# Patient Record
Sex: Male | Born: 2000 | Race: White | Hispanic: No | Marital: Single | Smoking: Never smoker
Health system: Southern US, Community
[De-identification: ages and names within clinical notes are randomized; demographics above are authoritative.]

## PROBLEM LIST (undated history)

## (undated) DIAGNOSIS — R102 Pelvic and perineal pain: Secondary | ICD-10-CM

## (undated) HISTORY — PX: TONSILECTOMY, ADENOIDECTOMY, BILATERAL MYRINGOTOMY AND TUBES: SHX2538

## (undated) HISTORY — PX: MOUTH SURGERY: SHX715

---
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
## 2023-04-28 ENCOUNTER — Telehealth: Payer: Self-pay

## 2023-04-28 NOTE — Telephone Encounter (Signed)
Copied from CRM [id number]. Topic: Appointments - Transfer of Care >> Apr 28, 2023  2:36 PM Samuel Jester B wrote: Pt is requesting to transfer FROM: [place], [facility] Dr. Noralyn Pick  Pt is requesting to transfer : Dr. Dana Allan Reason for requested transfer TO: He is now at the age of 27  It is the responsibility of the team the patient would like to transfer to (Dr. Dana Allan) to reach out to the patient if for any reason this transfer is not acceptable.  I spoke with patient.  Patient has already scheduled his appointment.  I let patient know that he needs to complete the new patient information prior to check-in.  Patient requested I mail him a packet.  I mailed new patient packet to patient.

## 2023-06-16 ENCOUNTER — Telehealth: Payer: Self-pay

## 2023-06-16 NOTE — Telephone Encounter (Signed)
 Dr Clent Ridges is leaving the practice and your New Patient or Transfer of Care appointment needs to be rescheduled with another provider. Please call the office to schedule a Transfer of Care to either Dr Charlann Lange, Darleen Crocker or Kara Dies, NP.   Thank you  LMOM for patient to call back. E2C2, please reschedule this New Patient visit. [facility]

## 2023-09-12 ENCOUNTER — Ambulatory Visit: Payer: 59 | Admitting: Family Medicine

## 2023-09-25 ENCOUNTER — Ambulatory Visit: Admitting: Nurse Practitioner

## 2023-09-25 ENCOUNTER — Encounter: Payer: Self-pay | Admitting: Nurse Practitioner

## 2023-09-25 VITALS — BP 118/72 | HR 85 | Temp 97.9°F | Ht 66.0 in | Wt 150.0 lb

## 2023-09-25 DIAGNOSIS — R41840 Attention and concentration deficit: Secondary | ICD-10-CM

## 2023-09-25 DIAGNOSIS — Z7689 Persons encountering health services in other specified circumstances: Secondary | ICD-10-CM

## 2023-09-25 DIAGNOSIS — R5383 Other fatigue: Secondary | ICD-10-CM | POA: Diagnosis not present

## 2023-09-25 DIAGNOSIS — R220 Localized swelling, mass and lump, head: Secondary | ICD-10-CM

## 2023-09-25 DIAGNOSIS — Z13220 Encounter for screening for lipoid disorders: Secondary | ICD-10-CM | POA: Diagnosis not present

## 2023-09-25 DIAGNOSIS — Z7289 Other problems related to lifestyle: Secondary | ICD-10-CM

## 2023-09-25 NOTE — Progress Notes (Signed)
 New Patient Office Visit  Subjective    Patient ID: Kristopher Olson, male    DOB: 04-06-2000  Age: 23 y.o. MRN: [id number]  CC:  Chief Complaint  Patient presents with   Establish Care   Discussed the use of a AI scribe software for clinical note transcription with the patient, who gave verbal consent to proceed.  HPI Kristopher Olson presents to establish care.  He was previously seen at [facility] pediatrics.  He has difficulty concentrating, describing himself as easily distracted and losing focus.  He would like to get tested for ADHD.  He feels tired frequently despite sleeping well, sometimes feeling he sleeps too much.  He exercises six times a week and works in [employer], spending most of his day outdoors.  He vapes daily and experiences a heavy feeling in his chest upon waking, sometimes coughing up black material with excessive vaping. He consumes alcohol socially, drinking six to seven cans of beer every other weekend or sometimes every weekend.  Outpatient Encounter Medications as of 09/25/2023  Medication Sig   flintstones complete (FLINTSTONES) 60 MG chewable tablet Chew 1 tablet by mouth daily. (Patient not taking: Reported on 09/25/2023)   ibuprofen (ADVIL,MOTRIN) 200 MG tablet Take 200 mg by mouth every 6 (six) hours as needed for headache. (Patient not taking: Reported on 09/25/2023)   Meth-Hyo-M Bl-Na Phos-Ph Sal (URIBEL ) 118 MG CAPS Take 1 capsule (118 mg total) by mouth every 6 (six) hours as needed (dysuria). (Patient not taking: Reported on 09/25/2023)   No facility-administered encounter medications on file as of 09/25/2023.    Past Medical History:  Diagnosis Date   Pelvic pain     Past Surgical History:  Procedure Laterality Date   CYSTOSCOPY N/A 06/18/2016   Procedure: CYSTOSCOPY;  Surgeon: Ozell JONELLE Burkes, MD;  Location: [facility] ORS;  Service: Urology;  Laterality: N/A;   MOUTH SURGERY     TONSILECTOMY, ADENOIDECTOMY, BILATERAL MYRINGOTOMY AND TUBES       Family History  Problem Relation Age of Onset   Asthma Mother    Learning disabilities Brother    Hypertension Maternal Grandfather    Hyperlipidemia Maternal Grandfather    Arthritis Paternal Grandmother     Social History   Socioeconomic History   Marital status: Single    Spouse name: Not on file   Number of children: Not on file   Years of education: Not on file   Highest education level: Not on file  Occupational History   Not on file  Tobacco Use   Smoking status: Never   Smokeless tobacco: Never  Vaping Use   Vaping status: Every Day  Substance and Sexual Activity   Alcohol use: Yes    Alcohol/week: 6.0 standard drinks of alcohol    Types: 6 Cans of beer per week    Comment: at weekend   Drug use: No   Sexual activity: Yes    Partners: Female  Other Topics Concern   Not on file  Social History Narrative   Not on file   Social Drivers of Health   Financial Resource Strain: Not on file  Food Insecurity: Not on file  Transportation Needs: Not on file  Physical Activity: Not on file  Stress: Not on file  Social Connections: Not on file  Intimate Partner Violence: Not on file    ROS      Objective    BP 118/72   Pulse 85   Temp 97.9 F (36.6 C)  Ht 5' 6 (1.676 m)   Wt 150 lb (68 kg)   SpO2 98%   BMI 24.21 kg/m   Physical Exam Constitutional:      Appearance: Normal appearance.  HENT:     Head:     Comments: 1.5 cm nontender lump on the posterior right occipital.    Right Ear: Tympanic membrane normal.     Left Ear: Tympanic membrane normal.     Mouth/Throat:     Mouth: Mucous membranes are moist.  Eyes:     Conjunctiva/sclera: Conjunctivae normal.     Pupils: Pupils are equal, round, and reactive to light.  Cardiovascular:     Rate and Rhythm: Normal rate and regular rhythm.     Pulses: Normal pulses.     Heart sounds: Normal heart sounds.  Pulmonary:     Effort: Pulmonary effort is normal.     Breath sounds: Normal breath  sounds.  Abdominal:     General: Bowel sounds are normal.     Palpations: Abdomen is soft.  Musculoskeletal:     Cervical back: Normal range of motion. No tenderness.  Skin:    General: Skin is warm.     Findings: No bruising.  Neurological:     General: No focal deficit present.     Mental Status: He is alert and oriented to person, place, and time. Mental status is at baseline.  Psychiatric:        Mood and Affect: Mood normal.        Behavior: Behavior normal.        Thought Content: Thought content normal.        Judgment: Judgment normal.         Assessment & Plan:   Problem List Items Addressed This Visit       Unprioritized   Other fatigue - Primary   Intermittent fatigue and tiredness. -Will check labs as ordered      Relevant Orders   CBC with Differential/Platelet (Completed)   Comprehensive metabolic panel with GFR (Completed)   TSH (Completed)   Head lump   Non-tender 1.5 cm lump on posterior right occipital for a few years and have slightly increased in size. - Will refer to dermatology for further evaluation and management.      Relevant Orders   Ambulatory referral to Dermatology   Disturbed concentration   He has difficulty concentrating, describing himself as easily distracted and losing focus.  He would like to get tested for ADHD. - Will refer for ADHD testing      Relevant Orders   Ambulatory referral to Psychiatry   Current every day vaping   Daily vaping with respiratory symptoms. Emphasized cessation due to potential harm from vaping substances.      Other Visit Diagnoses       Screening for lipid disorders       Relevant Orders   Lipid panel (Completed)       No follow-ups on file.   Kavari Parrillo, NP

## 2023-09-25 NOTE — Patient Instructions (Addendum)
 Today, you came in for a new patient appointment to establish care. We discussed several health concerns including growths on your head and finger, vaping, alcohol use, sexual health, and difficulty concentrating. We also reviewed your general health maintenance.  YOUR PLAN:  HEAD LUMP: You have non-painful Lump on your head that have been present for a few years and have slightly increased in size. -We will send to dermatology  NICOTINE DEPENDENCE: You vape daily and have experienced respiratory symptoms such as a heavy feeling in your chest and coughing up black material. -It is important to stop vaping due to the potential harm from vaping substances.  ALCOHOL USE: You consume alcohol socially, drinking six to seven cans of beer every other weekend or sometimes every weekend. -We discussed reducing your alcohol intake to mitigate health risks.  ATTENTION-DEFICIT/HYPERACTIVITY DISORDER (ADHD): You have difficulty concentrating, are easily distracted, and feel tired frequently despite sleeping well. -We will refer you for ADHD testing to confirm the diagnosis and guide treatment.

## 2023-09-26 LAB — CBC WITH DIFFERENTIAL/PLATELET
Basophils Absolute: 0 x10E3/uL (ref 0.0–0.2)
Basos: 1 %
EOS (ABSOLUTE): 0.1 x10E3/uL (ref 0.0–0.4)
Eos: 1 %
Hematocrit: 49 % (ref 37.5–51.0)
Hemoglobin: 15.7 g/dL (ref 13.0–17.7)
Immature Grans (Abs): 0 x10E3/uL (ref 0.0–0.1)
Immature Granulocytes: 0 %
Lymphocytes Absolute: 1.7 x10E3/uL (ref 0.7–3.1)
Lymphs: 27 %
MCH: 30.1 pg (ref 26.6–33.0)
MCHC: 32 g/dL (ref 31.5–35.7)
MCV: 94 fL (ref 79–97)
Monocytes Absolute: 0.7 x10E3/uL (ref 0.1–0.9)
Monocytes: 11 %
Neutrophils Absolute: 3.7 x10E3/uL (ref 1.4–7.0)
Neutrophils: 60 %
Platelets: 270 x10E3/uL (ref 150–450)
RBC: 5.22 x10E6/uL (ref 4.14–5.80)
RDW: 12.6 % (ref 11.6–15.4)
WBC: 6.2 x10E3/uL (ref 3.4–10.8)

## 2023-09-26 LAB — LIPID PANEL
Chol/HDL Ratio: 2.5 ratio (ref 0.0–5.0)
Cholesterol, Total: 160 mg/dL (ref 100–199)
HDL: 65 mg/dL (ref >39)
LDL Chol Calc (NIH): 79 mg/dL (ref 0–99)
Triglycerides: 89 mg/dL (ref 0–149)
VLDL Cholesterol Cal: 16 mg/dL (ref 5–40)

## 2023-09-26 LAB — COMPREHENSIVE METABOLIC PANEL WITH GFR
ALT: 13 IU/L (ref 0–44)
AST: 26 IU/L (ref 0–40)
Albumin: 5 g/dL (ref 4.3–5.2)
Alkaline Phosphatase: 55 IU/L (ref 44–121)
BUN/Creatinine Ratio: 13 (ref 9–20)
BUN: 13 mg/dL (ref 6–20)
Bilirubin Total: 1.7 mg/dL — ABNORMAL HIGH (ref 0.0–1.2)
CO2: 22 mmol/L (ref 20–29)
Calcium: 10.2 mg/dL (ref 8.7–10.2)
Chloride: 98 mmol/L (ref 96–106)
Creatinine, Ser: 1.03 mg/dL (ref 0.76–1.27)
Globulin, Total: 2 g/dL (ref 1.5–4.5)
Glucose: 68 mg/dL — ABNORMAL LOW (ref 70–99)
Potassium: 4.1 mmol/L (ref 3.5–5.2)
Sodium: 141 mmol/L (ref 134–144)
Total Protein: 7 g/dL (ref 6.0–8.5)
eGFR: 106 mL/min/1.73 (ref >59)

## 2023-09-26 LAB — TSH: TSH: 1.48 u[IU]/mL (ref 0.450–4.500)

## 2023-10-09 ENCOUNTER — Encounter: Admitting: Nurse Practitioner

## 2023-10-14 ENCOUNTER — Encounter: Payer: Self-pay | Admitting: Nurse Practitioner

## 2023-10-16 ENCOUNTER — Encounter: Payer: Self-pay | Admitting: Nurse Practitioner

## 2023-10-16 ENCOUNTER — Ambulatory Visit: Payer: Self-pay | Admitting: Nurse Practitioner

## 2023-10-16 DIAGNOSIS — R220 Localized swelling, mass and lump, head: Secondary | ICD-10-CM | POA: Insufficient documentation

## 2023-10-16 DIAGNOSIS — R17 Unspecified jaundice: Secondary | ICD-10-CM

## 2023-10-16 DIAGNOSIS — R41840 Attention and concentration deficit: Secondary | ICD-10-CM | POA: Insufficient documentation

## 2023-10-16 DIAGNOSIS — R5383 Other fatigue: Secondary | ICD-10-CM | POA: Insufficient documentation

## 2023-10-16 DIAGNOSIS — Z7289 Other problems related to lifestyle: Secondary | ICD-10-CM | POA: Insufficient documentation

## 2023-10-16 NOTE — Assessment & Plan Note (Addendum)
 Intermittent fatigue and tiredness. -Will check labs as ordered

## 2023-10-16 NOTE — Assessment & Plan Note (Addendum)
 Non-tender 1.5 cm lump on posterior right occipital for a few years and have slightly increased in size. - Will refer to dermatology for further evaluation and management.

## 2023-10-16 NOTE — Assessment & Plan Note (Signed)
 Daily vaping with respiratory symptoms. Emphasized cessation due to potential harm from vaping substances.

## 2023-10-16 NOTE — Assessment & Plan Note (Signed)
 He has difficulty concentrating, describing himself as easily distracted and losing focus.  He would like to get tested for ADHD. - Will refer for ADHD testing

## 2023-10-20 ENCOUNTER — Ambulatory Visit: Admitting: Dermatology

## 2023-10-20 ENCOUNTER — Encounter: Payer: Self-pay | Admitting: Dermatology

## 2023-10-20 DIAGNOSIS — D492 Neoplasm of unspecified behavior of bone, soft tissue, and skin: Secondary | ICD-10-CM

## 2023-10-20 DIAGNOSIS — L729 Follicular cyst of the skin and subcutaneous tissue, unspecified: Secondary | ICD-10-CM | POA: Diagnosis not present

## 2023-10-20 DIAGNOSIS — D485 Neoplasm of uncertain behavior of skin: Secondary | ICD-10-CM

## 2023-10-20 NOTE — Patient Instructions (Addendum)
 Pre-Operative Instructions You are scheduled for a surgical procedure at [facility]. We recommend you read the following instructions. If you have any questions or concerns, please call the office at [phone].  Shower and wash the entire body with soap and water the day of your surgery paying special attention to cleansing at and around the planned surgery site.  Please continue to take your anticoagulants (blood thinners) as you normally     would before and after surgery if they were prescribed by a medical provider. Stopping them could be harmful to you. We have multiple tools in dermatology to stop the bleeding even if you take an anticoagulant. If you take over the counter blood thinner such as aspirin, Ibuprofen (Motrin, Advil and Nuprin), Naprosyn , Voltaren , Relafen, etc. that was not prescribed or recommended by a medical provider, we recommend that you stop taking it for a week before your surgery and wait to restart until 2 days after your surgery.  Please inform us  of all medications you are currently taking. All medications that are taken regularly should be taken the day of surgery as you always do. Nevertheless, we need to be informed of what medications you are taking prior to surgery to know whether they will affect the procedure or cause any complications.   Please inform us  of any medication allergies. Also inform us  of whether you have allergies to Latex or rubber products or whether you have had any adverse reaction to Lidocaine  or Epinephrine .  Please inform us  of any prosthetic or artificial body parts such as artificial heart valve, joint replacements, etc., or similar condition that might require preoperative antibiotics.   We recommend avoidance of alcohol at least two weeks prior to surgery and continued avoidance for at least two weeks after surgery.   We recommend discontinuation of tobacco smoking at least two weeks prior to surgery and continued  abstinence for at least two weeks after surgery.  Do not plan strenuous exercise, strenuous work or strenuous lifting for approximately four weeks after your surgery.   We request if you are unable to make your scheduled surgical appointment, please call us  at least a week in advance or as soon as you are aware of a problem so that we can cancel or reschedule the appointment.   You MAY TAKE TYLENOL  (acetaminophen ) for pain as it is not a blood thinner.   PLEASE PLAN TO BE IN TOWN FOR TWO WEEKS FOLLOWING SURGERY, THIS IS IMPORTANT SO YOU CAN BE CHECKED FOR DRESSING CHANGES, FUTURE REMOVAL AND TO MONITOR FOR POSSIBLE COMPLICATIONS.   Due to recent changes in healthcare laws, you may see results of your pathology and/or laboratory studies on MyChart before the doctors have had a chance to review them. We understand that in some cases there may be results that are confusing or concerning to you. Please understand that not all results are received at the same time and often the doctors may need to interpret multiple results in order to provide you with the best plan of care or course of treatment. Therefore, we ask that you please give us  2 business days to thoroughly review all your results before contacting the office for clarification. Should we see a critical lab result, you will be contacted sooner.   If You Need Anything After Your Visit  If you have any questions or concerns for your doctor, please call our main line at [phone] and press option 4 to reach your doctor's medical assistant. If no  one answers, please leave a voicemail as directed and we will return your call as soon as possible. Messages left after 4 pm will be answered the following business day.   You may also send us  a message via MyChart. We typically respond to MyChart messages within 1-2 business days.  For prescription refills, please ask your pharmacy to contact our office. Our fax number is [phone].  If you have  an urgent issue when the clinic is closed that cannot wait until the next business day, you can page your doctor at the number below.    Please note that while we do our best to be available for urgent issues outside of office hours, we are not available 24/7.   If you have an urgent issue and are unable to reach us , you may choose to seek medical care at your doctor's office, retail clinic, urgent care center, or emergency room.  If you have a medical emergency, please immediately call 911 or go to the emergency department.  Pager Numbers  - Dr. Hester: [phone]  - Dr. Jackquline: [phone]  - Dr. Claudene: [phone]   In the event of inclement weather, please call our main line at [phone] for an update on the status of any delays or closures.  Dermatology Medication Tips: Please keep the boxes that topical medications come in in order to help keep track of the instructions about where and how to use these. Pharmacies typically print the medication instructions only on the boxes and not directly on the medication tubes.   If your medication is too expensive, please contact our office at [phone] option 4 or send us  a message through MyChart.   We are unable to tell what your co-pay for medications will be in advance as this is different depending on your insurance coverage. However, we may be able to find a substitute medication at lower cost or fill out paperwork to get insurance to cover a needed medication.   If a prior authorization is required to get your medication covered by your insurance company, please allow us  1-2 business days to complete this process.  Drug prices often vary depending on where the prescription is filled and some pharmacies may offer cheaper prices.  The website www.goodrx.com contains coupons for medications through different pharmacies. The prices here do not account for what the cost may be with help from insurance (it may be cheaper with  your insurance), but the website can give you the price if you did not use any insurance.  - You can print the associated coupon and take it with your prescription to the pharmacy.  - You may also stop by our office during regular business hours and pick up a GoodRx coupon card.  - If you need your prescription sent electronically to a different pharmacy, notify our office through [facility] or by phone at [phone] option 4.     Si Usted Necesita Algo Despus de Su Visita  Tambin puede enviarnos un mensaje a travs de Clinical cytogeneticist. Por lo general respondemos a los mensajes de MyChart en el transcurso de 1 a 2 das hbiles.  Para renovar recetas, por favor pida a su farmacia que se ponga en contacto con nuestra oficina. Randi lakes de fax es [place] [phone].  Si tiene un asunto urgente cuando la clnica est cerrada y que no puede esperar hasta el siguiente da hbil, puede llamar/localizar a su doctor(a) al nmero que aparece a continuacin.   Por favor, tenga en  cuenta que aunque hacemos todo lo posible para estar disponibles para asuntos urgentes fuera del horario de [place], no estamos disponibles las 24 horas del da, los 7 [address]  [address] de la [place].   Si tiene un problema urgente y no puede comunicarse con nosotros, puede optar por buscar atencin mdica  en el consultorio de su doctor(a), en una clnica privada, en un centro de atencin urgente o en una sala de emergencias.  Si tiene Engineer, drilling, por favor llame inmediatamente al 911 o vaya a la sala de emergencias.  Nmeros de bper  - Dr. Hester: [phone]  - Dra. Jackquline: [phone]  - Dr. Claudene: [phone]   En caso de inclemencias del tiempo, por favor llame a landry capes principal al [phone] para una actualizacin sobre el [place] de cualquier retraso o cierre.  Consejos para la medicacin en dermatologa: Por favor, guarde las cajas en las que vienen los medicamentos de uso tpico para  ayudarle a seguir las instrucciones sobre dnde y cmo usarlos. Las farmacias generalmente imprimen las instrucciones del medicamento slo en las cajas y no directamente en los tubos del Magazine.   Si su medicamento es muy caro, por favor, pngase en contacto con landry rieger llamando al [phone] y presione la opcin 4 o envenos un mensaje a travs de Clinical cytogeneticist.   No podemos decirle cul ser su copago por los medicamentos por adelantado ya que esto es diferente dependiendo de la cobertura de su seguro. Sin embargo, es posible que podamos encontrar un medicamento sustituto a Audiological scientist un formulario para que el seguro cubra el medicamento que se considera necesario.   Si se requiere una autorizacin previa para que su compaa de seguros malta su medicamento, por favor permtanos de 1 a 2 das hbiles para completar este proceso.  Los precios de los medicamentos varan con frecuencia dependiendo del Environmental consultant de dnde se surte la receta y alguna farmacias pueden ofrecer precios ms baratos.  El sitio web www.goodrx.com tiene cupones para medicamentos de Health and safety inspector. Los precios aqu no tienen en cuenta lo que podra costar con la ayuda del seguro (puede ser ms barato con su seguro), pero el sitio web puede darle el precio si no utiliz Tourist information centre manager.  - Puede imprimir el cupn correspondiente y llevarlo con su receta a la farmacia.  - Tambin puede pasar por nuestra oficina durante el horario de atencin regular y Education officer, museum una tarjeta de cupones de GoodRx.  - Si necesita que su receta se enve electrnicamente a una farmacia diferente, informe a nuestra oficina a travs de MyChart de [place] o por telfono llamando al [phone] y presione la opcin 4.

## 2023-10-20 NOTE — Progress Notes (Signed)
   New Patient Visit   Subjective  Kristopher Olson is a 23 y.o. male who presents for the following: Patient reports lump on head present x3 years, not painful unless try to squeeze. Patient also reports bump on R middle finger, not painful or itchy, unsure of how long it has been present, no known injury.    The following portions of the chart were reviewed this encounter and updated as appropriate: medications, allergies, medical history  Review of Systems:  No other skin or systemic complaints except as noted in HPI or Assessment and Plan.  Objective  Well appearing patient in no apparent distress; mood and affect are within normal limits.  A focused examination was performed of the following areas: Scalp, R hand   Relevant exam findings are noted in the Assessment and Plan.    Assessment & Plan   Neoplasm of uncertain behavior Exam: 4 mm homogeneous pink papule at R third finger overlying DIP  Ddx granuloma annulare (although no other lesions on hands or elbows vs verruca vs other  Treatment Plan: No obvious evidence of wart or cancer, recommend observation or consider biopsy if patient desires. Patient prefers to observe and call us  back with any changes.   Pilar cyst vs other cyst Exam: Subcutaneous mobile doughy nodule at occipital scalp  Benign-appearing. Exam most consistent with an epidermal inclusion cyst. Discussed that a cyst is a benign growth that can grow over time and sometimes get irritated or inflamed. Recommend observation if it is not bothersome. Discussed option of surgical excision to remove it if it is growing, symptomatic, or other changes noted. Please call for new or changing lesions so they can be evaluated.  Discussed to completely remove would require surgical excision. Patient advised that would require to cut hair around cyst, discussed possible line scar after excision, but hair would grow back around scar. risk of infection recurrence bleeding  pain. Patient will schedule excision with Dr. Claudene.      FOLLICULAR CYST OF SKIN AND SUBCUTANEOUS TISSUE   NEOPLASM OF UNCERTAIN BEHAVIOR OF SKIN    Return for w/ Dr. Claudene, Cyst Surgery.  I, Jacquelynn V. Wilfred, CMA, am acting as scribe for Boneta Claudene, MD .   Documentation: I have reviewed the above documentation for accuracy and completeness, and I agree with the above.  Boneta Claudene, MD

## 2023-10-29 ENCOUNTER — Encounter: Admitting: Dermatology

## 2023-11-04 ENCOUNTER — Other Ambulatory Visit (INDEPENDENT_AMBULATORY_CARE_PROVIDER_SITE_OTHER)

## 2023-11-04 DIAGNOSIS — R17 Unspecified jaundice: Secondary | ICD-10-CM | POA: Diagnosis not present

## 2023-11-04 LAB — HEPATIC FUNCTION PANEL
ALT: 19 U/L (ref 0–53)
AST: 19 U/L (ref 0–37)
Albumin: 4.8 g/dL (ref 3.5–5.2)
Alkaline Phosphatase: 44 U/L (ref 39–117)
Bilirubin, Direct: 0.3 mg/dL (ref 0.0–0.3)
Total Bilirubin: 1.4 mg/dL — ABNORMAL HIGH (ref 0.2–1.2)
Total Protein: 6.8 g/dL (ref 6.0–8.3)

## 2023-11-05 ENCOUNTER — Encounter: Payer: Self-pay | Admitting: Dermatology

## 2023-11-05 ENCOUNTER — Ambulatory Visit: Admitting: Dermatology

## 2023-11-05 DIAGNOSIS — D367 Benign neoplasm of other specified sites: Secondary | ICD-10-CM | POA: Diagnosis not present

## 2023-11-05 DIAGNOSIS — L728 Other follicular cysts of the skin and subcutaneous tissue: Secondary | ICD-10-CM

## 2023-11-05 DIAGNOSIS — L729 Follicular cyst of the skin and subcutaneous tissue, unspecified: Secondary | ICD-10-CM

## 2023-11-05 MED ORDER — MUPIROCIN 2 % EX OINT: 1.0000 | TOPICAL_OINTMENT | Freq: Every day | CUTANEOUS | 1 refills | Status: AC

## 2023-11-05 NOTE — Progress Notes (Signed)
   Follow-Up Visit   Subjective  Kristopher Olson is a 23 y.o. male who presents for the following: Pilar cyst vs other cyst occipital scalp, pt presents for excision   The following portions of the chart were reviewed this encounter and updated as appropriate: medications, allergies, medical history  Review of Systems:  No other skin or systemic complaints except as noted in HPI or Assessment and Plan.  Objective  Well appearing patient in no apparent distress; mood and affect are within normal limits.   A focused examination was performed of the following areas: scalp  Relevant exam findings are noted in the Assessment and Plan.  Occipital scalp Subcutaneous mobile doughy nodule 3.5 x 2.4cm   Assessment & Plan     FOLLICULAR CYST OF SKIN AND SUBCUTANEOUS TISSUE Occipital scalp Start Mupirocin  oint qd to excision site Skin excision - Occipital scalp  Excision method:  elliptical Lesion length (cm):  3.5 Lesion width (cm):  2.4 Margin per side (cm):  0 Total excision diameter (cm):  3.5 Informed consent: discussed and consent obtained   Timeout: patient name, date of birth, surgical site, and procedure verified   Procedure prep:  Patient was prepped and draped in usual sterile fashion Prep type:  Chlorhexidine Anesthesia: the lesion was anesthetized in a standard fashion   Anesthetic:  1% lidocaine  w/ epinephrine 1-100,000 buffered w/ 8.4% NaHCO3 (18cc lido w/ epi) Instrument used: #15 blade   Hemostasis achieved with: suture, pressure and electrodesiccation   Outcome: patient tolerated procedure well with no complications    Skin repair - Occipital scalp Complexity:  Intermediate Final length (cm):  3 Informed consent: discussed and consent obtained   Timeout: patient name, date of birth, surgical site, and procedure verified   Procedure prep:  Patient was prepped and draped in usual sterile fashion Prep type:  Chlorhexidine Anesthesia: the lesion was  anesthetized in a standard fashion   Anesthetic:  1% lidocaine  w/ epinephrine 1-100,000 buffered w/ 8.4% NaHCO3 Reason for type of repair: reduce tension to allow closure, reduce the risk of dehiscence, infection, and necrosis, reduce subcutaneous dead space and avoid a hematoma, allow closure of the large defect and preserve normal anatomy   Undermining: edges could be approximated without difficulty   Subcutaneous layers (deep stitches):  Suture size:  3-0 Suture type: Monocryl (poliglecaprone 25)   Stitches:  Buried vertical mattress Fine/surface layer approximation (top stitches):  Suture size:  4-0 Suture type: Prolene (polypropylene)   Stitches comment:  Running locked Suture removal (days):  7 Hemostasis achieved with: suture, pressure and electrodesiccation Outcome: patient tolerated procedure well with no complications   Post-procedure details: sterile dressing applied and wound care instructions given   Dressing type: bacitracin    Specimen 1 - Surgical pathology Differential Diagnosis: Pilar Cyst vs Other Cyst  Check Margins: No Subcutaneous mobile doughy nodule 3.5 x 2.4cm Related Medications mupirocin  ointment (BACTROBAN ) 2 % Apply 1 Application topically daily. Qd to excision site  Return in 1 week (on 11/12/2023) for suture removal.  I, Sonya Hupman, RMA, am acting as scribe for Boneta Sharps, MD .   Documentation: I have reviewed the above documentation for accuracy and completeness, and I agree with the above.  Boneta Sharps, MD

## 2023-11-05 NOTE — Patient Instructions (Addendum)

## 2023-11-06 NOTE — Telephone Encounter (Signed)
Patient doing well after yesterdays surgery./sh 

## 2023-11-10 LAB — SURGICAL PATHOLOGY

## 2023-11-11 ENCOUNTER — Ambulatory Visit (INDEPENDENT_AMBULATORY_CARE_PROVIDER_SITE_OTHER): Admitting: Dermatology

## 2023-11-11 ENCOUNTER — Ambulatory Visit: Payer: Self-pay | Admitting: Dermatology

## 2023-11-11 ENCOUNTER — Encounter: Payer: Self-pay | Admitting: Dermatology

## 2023-11-11 DIAGNOSIS — Z5189 Encounter for other specified aftercare: Secondary | ICD-10-CM

## 2023-11-11 DIAGNOSIS — Z4802 Encounter for removal of sutures: Secondary | ICD-10-CM

## 2023-11-11 NOTE — Patient Instructions (Signed)
 Due to recent changes in healthcare laws, you may see results of your pathology and/or laboratory studies on MyChart before the doctors have had a chance to review them. We understand that in some cases there may be results that are confusing or concerning to you. Please understand that not all results are received at the same time and often the doctors may need to interpret multiple results in order to provide you with the best plan of care or course of treatment. Therefore, we ask that you please give us  2 business days to thoroughly review all your results before contacting the office for clarification. Should we see a critical lab result, you will be contacted sooner.   If You Need Anything After Your Visit  If you have any questions or concerns for your doctor, please call our main line at [phone] and press option 4 to reach your doctor's medical assistant. If no one answers, please leave a voicemail as directed and we will return your call as soon as possible. Messages left after 4 pm will be answered the following business day.   You may also send us  a message via MyChart. We typically respond to MyChart messages within 1-2 business days.  For prescription refills, please ask your pharmacy to contact our office. Our fax number is [phone].  If you have an urgent issue when the clinic is closed that cannot wait until the next business day, you can page your doctor at the number below.    Please note that while we do our best to be available for urgent issues outside of office hours, we are not available 24/7.   If you have an urgent issue and are unable to reach us , you may choose to seek medical care at your doctor's office, retail clinic, urgent care center, or emergency room.  If you have a medical emergency, please immediately call 911 or go to the emergency department.  Pager Numbers  - Dr. Hester: [phone]  - Dr. Jackquline: [phone]  - Dr. Claudene: [phone]    - Dr. Raymund: [phone]  In the event of inclement weather, please call our main line at [phone] for an update on the status of any delays or closures.  Dermatology Medication Tips: Please keep the boxes that topical medications come in in order to help keep track of the instructions about where and how to use these. Pharmacies typically print the medication instructions only on the boxes and not directly on the medication tubes.   If your medication is too expensive, please contact our office at [phone] option 4 or send us  a message through MyChart.   We are unable to tell what your co-pay for medications will be in advance as this is different depending on your insurance coverage. However, we may be able to find a substitute medication at lower cost or fill out paperwork to get insurance to cover a needed medication.   If a prior authorization is required to get your medication covered by your insurance company, please allow us  1-2 business days to complete this process.  Drug prices often vary depending on where the prescription is filled and some pharmacies may offer cheaper prices.  The website www.goodrx.com contains coupons for medications through different pharmacies. The prices here do not account for what the cost may be with help from insurance (it may be cheaper with your insurance), but the website can give you the price if you did not use any insurance.  - You can print the  associated coupon and take it with your prescription to the pharmacy.  - You may also stop by our office during regular business hours and pick up a GoodRx coupon card.  - If you need your prescription sent electronically to a different pharmacy, notify our office through [facility] or by phone at [phone] option 4.     Si Usted Necesita Algo Despus de Su Visita  Tambin puede enviarnos un mensaje a travs de Clinical cytogeneticist. Por lo general respondemos a los mensajes de MyChart en el  transcurso de 1 a 2 das hbiles.  Para renovar recetas, por favor pida a su farmacia que se ponga en contacto con nuestra oficina. Randi lakes de fax es [place] [phone].  Si tiene un asunto urgente cuando la clnica est cerrada y que no puede esperar hasta el siguiente da hbil, puede llamar/localizar a su doctor(a) al nmero que aparece a continuacin.   Por favor, tenga en cuenta que aunque hacemos todo lo posible para estar disponibles para asuntos urgentes fuera del horario de [place], no estamos disponibles las 24 horas del da, los 7 [address]  [address] de la [place].   Si tiene un problema urgente y no puede comunicarse con nosotros, puede optar por buscar atencin mdica  en el consultorio de su doctor(a), en una clnica privada, en un centro de atencin urgente o en una sala de emergencias.  Si tiene Engineer, drilling, por favor llame inmediatamente al 911 o vaya a la sala de emergencias.  Nmeros de bper  - Dr. Hester: [phone]  - Dra. Jackquline: [phone]  - Dr. Claudene: [phone]   En caso de inclemencias del tiempo, por favor llame a landry capes principal al [phone] para una actualizacin sobre el [place] de cualquier retraso o cierre.  Consejos para la medicacin en dermatologa: Por favor, guarde las cajas en las que vienen los medicamentos de uso tpico para ayudarle a seguir las instrucciones sobre dnde y cmo usarlos. Las farmacias generalmente imprimen las instrucciones del medicamento slo en las cajas y no directamente en los tubos del [place].   Si su medicamento es muy caro, por favor, pngase en contacto con landry rieger llamando al [phone] y presione la opcin 4 o envenos un mensaje a travs de Clinical cytogeneticist.   No podemos decirle cul ser su copago por los medicamentos por adelantado ya que esto es diferente dependiendo de la cobertura de su seguro. Sin embargo, es posible que podamos encontrar un medicamento sustituto a Audiological scientist un  formulario para que el seguro cubra el medicamento que se considera necesario.   Si se requiere una autorizacin previa para que su compaa de seguros malta su medicamento, por favor permtanos de 1 a 2 das hbiles para completar este proceso.  Los precios de los medicamentos varan con frecuencia dependiendo del Environmental consultant de dnde se surte la receta y alguna farmacias pueden ofrecer precios ms baratos.  El sitio web www.goodrx.com tiene cupones para medicamentos de Health and safety inspector. Los precios aqu no tienen en cuenta lo que podra costar con la ayuda del seguro (puede ser ms barato con su seguro), pero el sitio web puede darle el precio si no utiliz Tourist information centre manager.  - Puede imprimir el cupn correspondiente y llevarlo con su receta a la farmacia.  - Tambin puede pasar por nuestra oficina durante el horario de atencin regular y Education officer, museum una tarjeta de cupones de GoodRx.  - Si necesita que su receta se enve electrnicamente a Psychiatrist, informe a nuestra oficina  a travs de MyChart de [place] o por telfono llamando al [phone] y presione la opcin 4.

## 2023-11-11 NOTE — Progress Notes (Signed)
   Follow-Up Visit   Subjective  Kristopher Olson is a 23 y.o. male who presents for the following: Suture removal  Pathology showed schwannoma  The following portions of the chart were reviewed this encounter and updated as appropriate: medications, allergies, medical history  Review of Systems:  No other skin or systemic complaints except as noted in HPI or Assessment and Plan.  Objective  Well appearing patient in no apparent distress; mood and affect are within normal limits.  Areas Examined: The scalp  Relevant physical exam findings are noted in the Assessment and Plan.    Assessment & Plan   VISIT FOR WOUND CHECK   ENCOUNTER FOR REMOVAL OF SUTURES   Encounter for Removal of Sutures - Incision site is clean, dry and intact. - Wound cleansed, sutures removed, wound cleansed and steri strips applied.  - Discussed pathology results showing schwannoma - Patient advised to keep steri-strips dry until they fall off. - Scars remodel for a full year. - Once steri-strips fall off, patient can apply over-the-counter silicone scar cream once to twice a day to help with scar remodeling if desired. - Patient advised to call with any concerns or if they notice any new or changing lesions.  Return if symptoms worsen or fail to improve.  LILLETTE Rosina Mayans, CMA, am acting as scribe for Boneta Sharps, MD .  Documentation: I have reviewed the above documentation for accuracy and completeness, and I agree with the above.  Boneta Sharps, MD

## 2023-11-12 ENCOUNTER — Encounter: Admitting: Dermatology
